# Patient Record
Sex: Male | Born: 1965 | Race: White | Hispanic: No | Marital: Single | Smoking: Current every day smoker
Health system: Southern US, Community
[De-identification: ages and names within clinical notes are randomized; demographics above are authoritative.]

## PROBLEM LIST (undated history)

## (undated) DIAGNOSIS — F419 Anxiety disorder, unspecified: Secondary | ICD-10-CM

---

## 2014-07-01 ENCOUNTER — Emergency Department (HOSPITAL_COMMUNITY)
Admission: EM | Admit: 2014-07-01 | Discharge: 2014-07-02 | Disposition: A | Discharge status: Home / Self Care | Payer: Medicare HMO | Attending: Emergency Medicine | Admitting: Emergency Medicine

## 2014-07-01 ENCOUNTER — Encounter (HOSPITAL_COMMUNITY): Payer: Self-pay | Admitting: Emergency Medicine

## 2014-07-01 ENCOUNTER — Emergency Department (HOSPITAL_COMMUNITY): Payer: Medicare HMO

## 2014-07-01 DIAGNOSIS — M79604 Pain in right leg: Secondary | ICD-10-CM | POA: Diagnosis not present

## 2014-07-01 DIAGNOSIS — M79605 Pain in left leg: Secondary | ICD-10-CM | POA: Diagnosis not present

## 2014-07-01 DIAGNOSIS — Z791 Long term (current) use of non-steroidal anti-inflammatories (NSAID): Secondary | ICD-10-CM | POA: Insufficient documentation

## 2014-07-01 DIAGNOSIS — F419 Anxiety disorder, unspecified: Secondary | ICD-10-CM | POA: Insufficient documentation

## 2014-07-01 DIAGNOSIS — F200 Paranoid schizophrenia: Secondary | ICD-10-CM | POA: Insufficient documentation

## 2014-07-01 DIAGNOSIS — I10 Essential (primary) hypertension: Secondary | ICD-10-CM | POA: Insufficient documentation

## 2014-07-01 DIAGNOSIS — Z5900 Homelessness unspecified: Secondary | ICD-10-CM

## 2014-07-01 DIAGNOSIS — F29 Unspecified psychosis not due to a substance or known physiological condition: Secondary | ICD-10-CM | POA: Insufficient documentation

## 2014-07-01 DIAGNOSIS — Z720 Tobacco use: Secondary | ICD-10-CM | POA: Diagnosis not present

## 2014-07-01 DIAGNOSIS — R609 Edema, unspecified: Secondary | ICD-10-CM | POA: Insufficient documentation

## 2014-07-01 DIAGNOSIS — R000 Tachycardia, unspecified: Secondary | ICD-10-CM | POA: Diagnosis not present

## 2014-07-01 DIAGNOSIS — Z79899 Other long term (current) drug therapy: Secondary | ICD-10-CM | POA: Diagnosis not present

## 2014-07-01 DIAGNOSIS — R440 Auditory hallucinations: Secondary | ICD-10-CM | POA: Diagnosis present

## 2014-07-01 DIAGNOSIS — R443 Hallucinations, unspecified: Secondary | ICD-10-CM

## 2014-07-01 HISTORY — DX: Anxiety disorder, unspecified: F41.9

## 2014-07-01 LAB — ETHANOL: Alcohol, Ethyl (B): <11 mg/dL (ref 0–11)

## 2014-07-01 LAB — CBC
HCT: 42 % (ref 39.0–52.0)
Hemoglobin: 13.9 g/dL (ref 13.0–17.0)
MCH: 30.7 pg (ref 26.0–34.0)
MCHC: 33.1 g/dL (ref 30.0–36.0)
MCV: 92.7 fL (ref 78.0–100.0)
Platelets: 191 10*3/uL (ref 150–400)
RBC: 4.53 MIL/uL (ref 4.22–5.81)
RDW: 13 % (ref 11.5–15.5)
WBC: 8.1 10*3/uL (ref 4.0–10.5)

## 2014-07-01 LAB — RAPID URINE DRUG SCREEN, HOSP PERFORMED
AMPHETAMINES: NOT DETECTED
BARBITURATES: NOT DETECTED
Benzodiazepines: NOT DETECTED
COCAINE: NOT DETECTED
OPIATES: NOT DETECTED
TETRAHYDROCANNABINOL: NOT DETECTED

## 2014-07-01 LAB — COMPREHENSIVE METABOLIC PANEL
ALBUMIN: 3.9 g/dL (ref 3.5–5.2)
ALT: 36 U/L (ref 0–53)
ANION GAP: 12 (ref 5–15)
AST: 22 U/L (ref 0–37)
Alkaline Phosphatase: 66 U/L (ref 39–117)
BILIRUBIN TOTAL: 0.5 mg/dL (ref 0.3–1.2)
BUN: 15 mg/dL (ref 6–23)
CO2: 26 mEq/L (ref 19–32)
Calcium: 9.5 mg/dL (ref 8.4–10.5)
Chloride: 100 mEq/L (ref 96–112)
Creatinine, Ser: 0.94 mg/dL (ref 0.50–1.35)
GFR calc Af Amer: >90 mL/min (ref >90)
GFR calc non Af Amer: >90 mL/min (ref >90)
GLUCOSE: 89 mg/dL (ref 70–99)
Potassium: 4.5 mEq/L (ref 3.7–5.3)
SODIUM: 138 meq/L (ref 137–147)
TOTAL PROTEIN: 7.5 g/dL (ref 6.0–8.3)

## 2014-07-01 LAB — URINALYSIS, ROUTINE W REFLEX MICROSCOPIC
Glucose, UA: NEGATIVE mg/dL
HGB URINE DIPSTICK: NEGATIVE
Ketones, ur: NEGATIVE mg/dL
Leukocytes, UA: NEGATIVE
NITRITE: NEGATIVE
Protein, ur: NEGATIVE mg/dL
Specific Gravity, Urine: 1.027 (ref 1.005–1.030)
UROBILINOGEN UA: 4 mg/dL — AB (ref 0.0–1.0)
pH: 6.5 (ref 5.0–8.0)

## 2014-07-01 LAB — ACETAMINOPHEN LEVEL

## 2014-07-01 LAB — SALICYLATE LEVEL: Salicylate Lvl: <2 mg/dL — ABNORMAL LOW (ref 2.8–20.0)

## 2014-07-01 MED ORDER — SODIUM CHLORIDE 0.9 % IV BOLUS (SEPSIS)
1000.0000 mL | Freq: Once | INTRAVENOUS | Status: AC
  Administered 2014-07-01: 1000 mL via INTRAVENOUS

## 2014-07-01 NOTE — B[facility] Counselor (Signed)
TTS Counselor spoke with pt's PA [name]([name], PA-C) and informed him that she would be initiating pt's assessment shortly. TTS counselor inquired about any additional patient information that would be helpful before beginning the assessment, and PA-C briefly discussed pt's hx and presenting complaints.       Cyndie MullAnna Augusten Lipkin, [facility] T[facility] Specialist

## 2014-07-01 NOTE — ED Notes (Signed)
Pt homeless c/o right leg pain in which he reports he lost his prescription for pain. Pt also reports that he has auditory and visual hallucinations of angels and "in the bible" and "Loistine Chancehilip tells me not to ride the bus". He denies SI or HI. He reports that used crack 3 months ago. Pt very talkative and rambling about many things upon assessment.

## 2014-07-01 NOTE — [facility] Counselor (Signed)
Assessment completed. Pt is endorsing auditory and visual hallucinations and delusional thought content but denying any SI/HI.  Nanine MeansJamison Lord, NP recommends pt be kept overnight and re-evaluated by psychiatry in the morning.       Cyndie MullAnna Sham Alviar, [facility]

## 2014-07-01 NOTE — ED Provider Notes (Signed)
CSN: [id number]     Arrival date & time 07/01/14  1826 History   First MD Initiated Contact with Patient 07/01/14 1941     Chief Complaint  Patient presents with  . Hallucinations  . Leg Pain   Manuel Nichols is a 48 y.o. male with a history of paranoid schizophrenia, anxiety, hypertension and possibly diabetes who presents to the emergency department complaining of bilateral leg pain for the past 5 days as well as auditory and visual hallucinations for the past 3 days. The patient reports that he is coming from [facility] where he was admitted from 04/27/2014 until 06/26/14 on inpatient psychiatry for schizophrenia. The patient reports he [place] pass [place] [facility] [place]. The patient reports he got off in [place] because he heard voices telling him he would be stabbed in [place] has been in [place] for the past week. Patient ports in and off his psychiatric medications for the past 3 days because they were stolen. The patient endorses auditory and visual hallucinations. The patient endorses visual hallucinations of spiritual people. The patient endorses paranoia, thought insertion, thought withdrawal and thought broadcasting. He reports being afraid of a girl who is after him and he is unsure if this is a hallucination. The patient reports he has used crack before but last used 3 months ago. He denies other illicit drug use.  Patient's only physical complaint currently is bilateral leg pain which he states he takes gabapentin. The patient denies suicidal or homicidal ideations. The patient denies fevers, chills, chest pain, shortness of breath, cough, abdominal pain, nausea, vomiting, or diarrhea.   (Consider location/radiation/quality/duration/timing/severity/associated sxs/prior Treatment) HPI  Past Medical History  Diagnosis Date  . Anxiety    History reviewed. No pertinent past surgical history. No family history on file. History  Substance Use  Topics  . Smoking status: Current Every Day Smoker -- 0.50 packs/day    Types: Cigarettes  . Smokeless tobacco: Not on file  . Alcohol Use: Yes     Comment: rarely    Review of Systems  Constitutional: Negative for fever, chills and appetite change.  HENT: Negative for congestion and sore throat.   Eyes: Negative for visual disturbance.  Respiratory: Negative for cough, shortness of breath and wheezing.   Cardiovascular: Negative for chest pain, palpitations and leg swelling.  Gastrointestinal: Negative for nausea, vomiting, abdominal pain and diarrhea.  Genitourinary: Negative for dysuria.  Musculoskeletal: Negative for back pain and neck pain.       Bilateral leg pain  Skin: Negative for rash.  Neurological: Negative for headaches.  Psychiatric/Behavioral: Positive for hallucinations. Negative for suicidal ideas. The patient is nervous/anxious.   All other systems reviewed and are negative.     Allergies  Review of patient's allergies indicates no known allergies.  Home Medications   Prior to Admission medications   Medication Sig Start Date End Date Taking? Authorizing Provider  ARIPiprazole 400 MG SUSR Inject 400 mg into the muscle every 30 (thirty) days.   Yes Historical Provider, MD  gabapentin (NEURONTIN) 600 MG tablet Take 1,200 mg by mouth 3 (three) times daily. 04/17/14  Yes Historical Provider, MD  METFORMIN HCL PO Take by mouth. Unknown dosage was a new prescription and patient lost--pharmacy is unable to verify.   Yes Historical Provider, MD  OLANZapine (ZYPREXA PO) Take by mouth. Unknown dosage was a new prescription and patient lost--pharmacy is unable to verify.   Yes Historical Provider, MD  divalproex (DEPAKOTE ER) 500  MG 24 hr tablet Take 1,000 mg by mouth 3 (three) times daily.  03/31/14   Historical Provider, MD  lisinopril (PRINIVIL,ZESTRIL) 10 MG tablet Take 10 mg by mouth daily. 04/17/14   Historical Provider, MD  naproxen (NAPROSYN) 500 MG tablet Take 500  mg by mouth 2 (two) times daily. 04/17/14   Historical Provider, MD  traZODone (DESYREL) 100 MG tablet Take 100 mg by mouth at bedtime. 03/31/14   Historical Provider, MD   BP 142/82 mmHg  Pulse 110  Temp(Src) 98.8 F (37.1 C) (Oral)  Resp 22  SpO2 93% Physical Exam  Constitutional: He is oriented to person, place, and time. He appears well-developed and well-nourished. No distress.  HENT:  Head: Normocephalic and atraumatic.  Mouth/Throat: Oropharynx is clear and moist.  Eyes: Conjunctivae are normal. Pupils are equal, round, and reactive to light. Right eye exhibits no discharge. Left eye exhibits no discharge.  Neck: Neck supple.  Cardiovascular: Regular rhythm, normal heart sounds and intact distal pulses.  Exam reveals no gallop and no friction rub.   No murmur heard. Tachycardic at 112. Bilateral radial pulses are intact. Bilateral posterior tibialis pulses are intact.  Pulmonary/Chest: Effort normal and breath sounds normal. No respiratory distress. He has no wheezes. He has no rales.  Abdominal: Soft. There is no tenderness.  Musculoskeletal: Normal range of motion. He exhibits edema.  Patient's strength is 5 out of 5 in his bilateral upper and lower extremities. Patient stable hemodynamically without difficulty or assistance. The patient has a mild amount of lower extremity edema. Bilateral posterior tibialis pulses are intact. Sensation is intact in his bilateral lower extremities.  Lymphadenopathy:    He has no cervical adenopathy.  Neurological: He is alert and oriented to person, place, and time. No cranial nerve deficit. Coordination normal.  Patient is alert and oriented 3. Cranial nerves II through XII are intact bilaterally. Patient has good sensation in his face as well as bilateral lower extremities.  Skin: Skin is warm and dry. No rash noted. He is not diaphoretic. No erythema. No pallor.  Psychiatric: His mood appears anxious. Thought content is paranoid. He expresses  no homicidal and no suicidal ideation. He expresses no suicidal plans and no homicidal plans.  Patient endorses visual and auditory hallucinations. Patient endorses thought insertion, thought withdrawal and thought broadcasting. Patient endorses paranoia. Patient's thought processes tangential.  Nursing note and vitals reviewed.   ED Course  Procedures (including critical care time) Labs Review Labs Reviewed  SALICYLATE LEVEL - Abnormal; Notable for the following:    Salicylate Lvl <2.0 (*)    All other components within normal limits  URINALYSIS, ROUTINE W REFLEX MICROSCOPIC - Abnormal; Notable for the following:    Color, Urine AMBER (*)    Bilirubin Urine SMALL (*)    Urobilinogen, UA 4.0 (*)    All other components within normal limits  ACETAMINOPHEN LEVEL  CBC  COMPREHENSIVE METABOLIC PANEL  ETHANOL  URINE RAPID DRUG SCREEN (HOSP PERFORMED)    Imaging Review Dg Chest 2 View  07/01/2014   CLINICAL DATA:  Visual hallucinations.  Right leg pain.  EXAM: CHEST  2 VIEW  COMPARISON:  None.  FINDINGS: Lungs are adequately inflated without focal consolidation or effusion. There is mild cardiomegaly. Mild elevation of the right hemidiaphragm. Mild degenerative change of the spine is present.  IMPRESSION: No acute cardiopulmonary disease.  Mild cardiomegaly.   Electronically Signed   By: Elberta Fortisaniel  Boyle M.D.   On: 07/01/2014 21:48  EKG Interpretation None      Filed Vitals:   07/01/14 1848 07/01/14 2231 07/01/14 2314  BP: 145/71 132/64 142/82  Pulse: 124 105 110  Temp: 99.9 F (37.7 C) 98.5 F (36.9 C) 98.8 F (37.1 C)  TempSrc: Oral Oral Oral  Resp: 18 18 22   SpO2: 96% 96% 93%     MDM   Meds given in ED:  Medications  alum & mag hydroxide-simeth (MAALOX/MYLANTA) 200-200-20 MG/5ML suspension 30 mL (not administered)  ondansetron (ZOFRAN) tablet 4 mg (not administered)  nicotine (NICODERM CQ - dosed in mg/24 hours) patch 21 mg (not administered)  ibuprofen  (ADVIL,MOTRIN) tablet 600 mg (not administered)  acetaminophen (TYLENOL) tablet 650 mg (not administered)  divalproex (DEPAKOTE ER) 24 hr tablet 1,000 mg (not administered)  gabapentin (NEURONTIN) capsule 1,200 mg (not administered)  lisinopril (PRINIVIL,ZESTRIL) tablet 10 mg (not administered)  traZODone (DESYREL) tablet 100 mg (not administered)  sodium chloride 0.9 % bolus 1,000 mL (0 mLs Intravenous Stopped 07/02/14 0014)    New Prescriptions   No medications on file    Final diagnoses:  Hallucinations  Homeless   Manuel Nichols is a 48 y.o. male with a history of paranoid schizophrenia, anxiety, hypertension and possibly diabetes who presents to the emergency department complaining of bilateral leg pain for the past 5 days as well as auditory and visual hallucinations for the past 3 days.The patient reports that he is coming from [facility] where he was admitted from 04/27/2014 until 06/26/14 on inpatient psychiatry for schizophrenia. The patient reports he was given a bus pass to [facility] where his brother lives. The patient reports he got off in [place].   Patient's urine drug screen was negative. Patient's urinalysis was unremarkable. Patient had a negative acetaminophen, salicylate and alcohol level. Patient's CBC and CMP are unremarkable. Patient's chest x-ray and indicated mild cardiomegaly but no evidence of acute cardiopulmonary process. The patient is afebrile and non-toxic appearing. After discussing with Dr. Freida Busman, will medically clear this patient for evaluation by TTS.  TTS would like the patient to stay overnight so that he can be evaluated by psychiatry. Will transfer to the psych ED. Patient is in agreement with staying.   This patient was discussed with Dr. Freida Busman who agrees with assessment and plan.    Lawana Chambers, PA-C 07/02/14 0129  Toy Baker, MD 07/02/14 [phone]

## 2014-07-01 NOTE — [facility] Counselor (Addendum)
TTS Counselor spoke with pt's PA [name](Manuel Dansie, Manuel Nichols) and attending physician (Dr. Lorre NickAnthony Seab) and informed them of disposition. Pt is recommended to stay overnight and be re-evaluated by psychiatry in the morning.   Everlene FarrierWilliam Dansie, Manuel Nichols stated that pt would be moved to [facility] so that he can be seen by psychiatry in the AM.     Manuel MullAnna Sakara Nichols, [facility] T[facility]ist

## 2014-07-02 ENCOUNTER — Encounter (HOSPITAL_COMMUNITY): Payer: Self-pay | Admitting: Registered Nurse

## 2014-07-02 DIAGNOSIS — F29 Unspecified psychosis not due to a substance or known physiological condition: Secondary | ICD-10-CM | POA: Diagnosis present

## 2014-07-02 MED ORDER — IBUPROFEN 200 MG PO TABS: 600.0000 mg | ORAL_TABLET | Freq: Three times a day (TID) | ORAL | Status: DC | PRN

## 2014-07-02 MED ORDER — DIVALPROEX SODIUM ER 500 MG PO TB24
1000.0000 mg | ORAL_TABLET | Freq: Three times a day (TID) | ORAL | Status: DC
  Administered 2014-07-02 (×2): 1000 mg via ORAL
  Filled 2014-07-02 (×4): qty 2

## 2014-07-02 MED ORDER — DIVALPROEX SODIUM ER 500 MG PO TB24: 1000.0000 mg | ORAL_TABLET | Freq: Three times a day (TID) | ORAL | Status: AC

## 2014-07-02 MED ORDER — ACETAMINOPHEN 325 MG PO TABS: 650.0000 mg | ORAL_TABLET | ORAL | Status: DC | PRN

## 2014-07-02 MED ORDER — TRAZODONE HCL 100 MG PO TABS: 100.0000 mg | ORAL_TABLET | Freq: Every day | ORAL | Status: AC

## 2014-07-02 MED ORDER — ALUM & MAG HYDROXIDE-SIMETH 200-200-20 MG/5ML PO SUSP: 30.0000 mL | ORAL | Status: DC | PRN

## 2014-07-02 MED ORDER — GABAPENTIN 300 MG PO CAPS
1200.0000 mg | ORAL_CAPSULE | Freq: Three times a day (TID) | ORAL | Status: DC
  Administered 2014-07-02 (×2): 1200 mg via ORAL
  Filled 2014-07-02 (×2): qty 4

## 2014-07-02 MED ORDER — LISINOPRIL 10 MG PO TABS
10.0000 mg | ORAL_TABLET | Freq: Every day | ORAL | Status: DC
  Filled 2014-07-02 (×2): qty 1

## 2014-07-02 MED ORDER — NICOTINE 21 MG/24HR TD PT24: 21.0000 mg | MEDICATED_PATCH | Freq: Every day | TRANSDERMAL | Status: DC

## 2014-07-02 MED ORDER — ONDANSETRON HCL 4 MG PO TABS: 4.0000 mg | ORAL_TABLET | Freq: Three times a day (TID) | ORAL | Status: DC | PRN

## 2014-07-02 MED ORDER — OLANZAPINE 5 MG PO TABS: 5.0000 mg | ORAL_TABLET | Freq: Every day | ORAL | Status: AC

## 2014-07-02 MED ORDER — TRAZODONE HCL 100 MG PO TABS
100.0000 mg | ORAL_TABLET | Freq: Every day | ORAL | Status: DC
  Administered 2014-07-02: 100 mg via ORAL
  Filled 2014-07-02: qty 1

## 2014-07-02 MED ORDER — ARIPIPRAZOLE ER 400 MG IM SUSR: 400.0000 mg | INTRAMUSCULAR | Status: DC

## 2014-07-02 NOTE — Discharge Instructions (Signed)
Stress Stress-related medical problems are becoming increasingly common. The body has a built-in physical response to stressful situations. Faced with pressure, challenge or danger, we need to react quickly. Our bodies release hormones such as cortisol and adrenaline to help do this. These hormones are part of the "fight or flight" response and affect the metabolic rate, heart rate and blood pressure, resulting in a heightened, stressed state that prepares the body for optimum performance in dealing with a stressful situation. It is likely that early man required these mechanisms to stay alive, but usually modern stresses do not call for this, and the same hormones released in today's world can damage health and reduce coping ability. CAUSES  Pressure to perform at work, at school or in sports.  Threats of physical violence.  Money worries.  Arguments.  Family conflicts.  Divorce or separation from significant other.  Bereavement.  New job or unemployment.  Changes in location.  Alcohol or drug abuse. SOMETIMES, THERE IS NO PARTICULAR REASON FOR DEVELOPING STRESS. Almost all people are at risk of being stressed at some time in their lives. It is important to know that some stress is temporary and some is long term.  Temporary stress will go away when a situation is resolved. Most people can cope with short periods of stress, and it can often be relieved by relaxing, taking a walk or getting any type of exercise, chatting through issues with friends, or having a good night's sleep.  Chronic (long-term, continuous) stress is much harder to deal with. It can be psychologically and emotionally damaging. It can be harmful both for an individual and for friends and family. SYMPTOMS Everyone reacts to stress differently. There are some common effects that help [place] recognize it. In times of extreme stress, people may:  Shake uncontrollably.  Breathe faster and deeper than normal  (hyperventilate).  Vomit.  For people with asthma, stress can trigger an attack.  For some people, stress may trigger migraine headaches, ulcers, and body pain. PHYSICAL EFFECTS OF STRESS MAY INCLUDE:  Loss of energy.  Skin problems.  Aches and pains resulting from tense muscles, including neck ache, backache and tension headaches.  Increased pain from arthritis and other conditions.  Irregular heart beat (palpitations).  Periods of irritability or anger.  Apathy or depression.  Anxiety (feeling uptight or worrying).  Unusual behavior.  Loss of appetite.  Comfort eating.  Lack of concentration.  Loss of, or decreased, sex-drive.  Increased smoking, drinking, or recreational drug use.  For women, missed periods.  Ulcers, joint pain, and muscle pain. Post-traumatic stress is the stress caused by any serious accident, strong emotional damage, or extremely difficult or violent experience such as rape or war. Post-traumatic stress victims can experience mixtures of emotions such as fear, shame, depression, guilt or anger. It may include recurrent memories or images that may be haunting. These feelings can last for weeks, months or even years after the traumatic event that triggered them. Specialized treatment, possibly with medicines and psychological therapies, is available. If stress is causing physical symptoms, severe distress or making it difficult for you to function as normal, it is worth seeing your caregiver. It is important to remember that although stress is a usual part of life, extreme or prolonged stress can lead to other illnesses that will need treatment. It is better to visit a doctor sooner rather than later. Stress has been linked to the development of high blood pressure and heart disease, as well as insomnia and depression.  There is no diagnostic test for stress since everyone reacts to it differently. But a caregiver will be able to spot the physical  symptoms, such as:  Headaches.  Shingles.  Ulcers. Emotional distress such as intense worry, low mood or irritability should be detected when the doctor asks pertinent questions to identify any underlying problems that might be the cause. In case there are physical reasons for the symptoms, the doctor may also want to do some tests to exclude certain conditions. If you feel that you are suffering from stress, try to identify the aspects of your life that are causing it. Sometimes you may not be able to change or avoid them, but even a small change can have a positive ripple effect. A simple lifestyle change can make all the difference. STRATEGIES THAT CAN HELP DEAL WITH STRESS:  Delegating or sharing responsibilities.  Avoiding confrontations.  Learning to be more assertive.  Regular exercise.  Avoid using alcohol or street drugs to cope.  Eating a healthy, balanced diet, rich in fruit and vegetables and proteins.  Finding humor or absurdity in stressful situations.  Never taking on more than you know you can handle comfortably.  Organizing your time better to get as much done as possible.  Talking to friends or family and sharing your thoughts and fears.  Listening to music or relaxation tapes.  Relaxation techniques like deep breathing, meditation, and yoga.  Tensing and then relaxing your muscles, starting at the toes and working up to the head and neck. If you think that you would benefit from help, either in identifying the things that are causing your stress or in learning techniques to help you relax, see a caregiver who is capable of helping you with this. Rather than relying on medications, it is usually better to try and identify the things in your life that are causing stress and try to deal with them. There are many techniques of managing stress including counseling, psychotherapy, aromatherapy, yoga, and exercise. Your caregiver can help you determine what is best  for you. Document Released: 09/27/2002 Document Revised: 07/12/2013 Document Reviewed: 08/24/2007 [facility] Patient Information 2015 [place], [place]. This information is not intended to replace advice given to you by your health care provider. Make sure you discuss any questions you have with your health care provider.  Stress and Stress Management Stress is a normal reaction to life events. It is what you feel when life demands more than you are used to or more than you can handle. Some stress can be useful. For example, the stress reaction can help you catch the last bus of the day, study for a test, or meet a deadline at work. But stress that occurs too often or for too long can cause problems. It can affect your emotional health and interfere with relationships and normal daily activities. Too much stress can weaken your immune system and increase your risk for physical illness. If you already have a medical problem, stress can make it worse. CAUSES  All sorts of life events may cause stress. An event that causes stress for one person may not be stressful for another person. Major life events commonly cause stress. These may be positive or negative. Examples include losing your job, moving into a new home, getting married, having a baby, or losing a loved one. Less obvious life events may also cause stress, especially if they occur day after day or in combination. Examples include working long hours, driving in traffic, caring for children,  being in debt, or being in a difficult relationship. SIGNS AND SYMPTOMS Stress may cause emotional symptoms including, the following:  Anxiety. This is feeling worried, afraid, on edge, overwhelmed, or out of control.  Anger. This is feeling irritated or impatient.  Depression. This is feeling sad, down, helpless, or guilty.  Difficulty focusing, remembering, or making decisions. Stress may cause physical symptoms, including the following:   Aches and  pains. These may affect your head, neck, back, stomach, or other areas of your body.  Tight muscles or clenched jaw.  Low energy or trouble sleeping. Stress may cause unhealthy behaviors, including the following:   Eating to feel better (overeating) or skipping meals.  Sleeping too little, too much, or both.  Working too much or putting off tasks (procrastination).  Smoking, drinking alcohol, or using drugs to feel better. DIAGNOSIS  Stress is diagnosed through an assessment by your health care provider. Your health care provider will ask questions about your symptoms and any stressful life events.Your health care provider will also ask about your medical history and may order blood tests or other tests. Certain medical conditions and medicine can cause physical symptoms similar to stress. Mental illness can cause emotional symptoms and unhealthy behaviors similar to stress. Your health care provider may refer you to a mental health professional for further evaluation.  TREATMENT  Stress management is the recommended treatment for stress.The goals of stress management are reducing stressful life events and coping with stress in healthy ways.  Techniques for reducing stressful life events include the following:  Stress identification. Self-monitor for stress and identify what causes stress for you. These skills may help you to avoid some stressful events.  Time management. Set your priorities, keep a calendar of events, and learn to say "no." These tools can help you avoid making too many commitments. Techniques for coping with stress include the following:  Rethinking the problem. Try to think realistically about stressful events rather than ignoring them or overreacting. Try to find the positives in a stressful situation rather than focusing on the negatives.  Exercise. Physical exercise can release both physical and emotional tension. The key is to find a form of exercise you enjoy  and do it regularly.  Relaxation techniques. These relax the body and mind. Examples include yoga, meditation, tai chi, biofeedback, deep breathing, progressive muscle relaxation, listening to music, being out in nature, journaling, and other hobbies. Again, the key is to find one or more that you enjoy and can do regularly.  Healthy lifestyle. Eat a balanced diet, get plenty of sleep, and do not smoke. Avoid using alcohol or drugs to relax.  Strong support network. Spend time with family, friends, or other people you enjoy being around.Express your feelings and talk things over with someone you trust. Counseling or talktherapy with a mental health professional may be helpful if you are having difficulty managing stress on your own. Medicine is typically not recommended for the treatment of stress.Talk to your health care provider if you think you need medicine for symptoms of stress. HOME CARE INSTRUCTIONS  Keep all follow-up visits as directed by your health care provider.  Take all medicines as directed by your health care provider. SEEK MEDICAL CARE IF:  Your symptoms get worse or you start having new symptoms.  You feel overwhelmed by your problems and can no longer manage them on your own. SEEK IMMEDIATE MEDICAL CARE IF:  You feel like hurting yourself or someone else. Document Released: 12/31/2000 Document Revised:  11/21/2013 Document Reviewed: 03/01/2013 [employer] Patient Information 2015 [place], [place]. This information is not intended to replace advice given to you by your health care provider. Make sure you discuss any questions you have with your health care provider.  Psychosis Psychosis refers to a severe lack of understanding with reality. During a psychotic episode, you are not able to think clearly. During a psychotic episode, your responses and emotions are inappropriate and do not coincide with what is actually happening. You often have false beliefs about what is  happening or who you are (delusions), and you may see, hear, taste, smell, or feel things that are not present (hallucinations). Psychosis is usually a severe symptom of a very serious mental health (psychiatric) condition, but it can sometimes be the result of a medical condition. CAUSES   Psychiatric conditions, such as:  Schizophrenia.  Bipolar disorder.  Depression.  Personality disorders.  Alcohol or drug abuse.  Medical conditions, such as:  Brain injury.  Brain tumor.  Dementia.  Brain diseases, such as Alzheimer's, Parkinson's, or Huntington's disease.  Neurological diseases, such as epilepsy.  Genetic disorders.  Metabolic disorders.  Infections that affect the brain.  Certain prescription drugs.  Stroke. SYMPTOMS   Unable to think or speak clearly or respond appropriately.  Disorganized thinking (thoughts jump from one thought to another).  Severe inappropriate behavior.  Delusions may include:  A strong belief that is odd, unrealistic, or false.  Feeling extremely fearful or suspicious (paranoid).  Believing you are someone else, have high importance, or have an altered identity.  Hallucinations. DIAGNOSIS   Mental health evaluation.  Physical exam.  Blood tests.  Computerized magnetic scan (MRI) or other brain scans. TREATMENT  Your caregiver will recommend a course of treatment that depends on the cause of the psychosis. Treatment may include:  Monitoring and supportive care in the hospital.  Taking medicines (antipsychotic medicine) to reduce symptoms and balance chemicals in the brain.  Taking medicines to manage underlying mental health conditions.  Therapy and other supportive programs outside of the hospital.  Treating an underlying medical condition. If the cause of the psychosis can be treated or corrected, the outlook is good. Without treatment, psychotic episodes can cause danger to yourself or others. Treatment may be  short-term or lifelong. HOME CARE INSTRUCTIONS   Take all medicines as directed. This is important.  Use a pillbox or write down your medicine schedule to make sure you are taking them.  Check with your caregiver before using over-the-counter medicines, herbs, or supplements.  Seek individual and family support through therapy and mental health education (psychoeducation) programs. These will help you manage symptoms and side effects of medicines, learn life skills, and maintain a healthy routine.  Maintain a healthy lifestyle.  Exercise regularly.  Avoid alcohol and drugs.  Learn ways to reduce stress and cope with stress, such as yoga and meditation.  Talk about your feelings with family members or caregivers.  Make time for yourself to do things you enjoy.  Know the early warning signs of psychosis. Your caregiver will recommend steps to take when you notice symptoms such as:  Feeling anxious or preoccupied.  Having racing thoughts.  Changes in your interest in life and relationships.  Follow up with your caregivers for continued outpatient treatment as directed. SEEK MEDICAL CARE IF:   Medicines do not seem to be helping.  You hear voices telling you to do things.  You see, smell, or feel things that are not there.  You feel  hopeless and overwhelmed.  You feel extremely fearful and suspicious that something will harm you.  You feel like you cannot leave your house.  You have trouble taking care of yourself.  You experience side effects of medicines, such as changes in sleep patterns, dizziness, weight gain, restlessness, movement changes, muscle spasms, or tremors. SEEK IMMEDIATE MEDICAL CARE IF:  Severe psychotic symptoms present a safety issue (such as an urge to hurt yourself or others). MAKE SURE YOU:   Understand these instructions.  Will watch your condition.  Will get help right away if you are not doing well or get worse. [place] of [facility]: [email or web address] Document Released: 12/25/2009 Document Revised: 09/29/2011 Document Reviewed: 12/25/2009 [facility] Patient Information 2015 [place], [place]. This information is not intended to replace advice given to you by your health care provider. Make sure you discuss any questions you have with your health care provider.

## 2014-07-02 NOTE — Progress Notes (Signed)
CSW faxed referral to the following facilities:  [facility]  [facility]  Adelene AmasEdith Cashae Weich, [place] Disposition Social Worker [phone]

## 2014-07-02 NOTE — BH Assessment (Signed)
Tele Assessment Note   Manuel Nichols is a single, Caucasian, 48 y.o. male [facility] bilateral leg pain and auditory and visual hallucinations. Pt presents with flat affect, euthymic mood, poor hygiene, and fair eye-contact. Speech is slow and pt is clearly drowsy, but he is cooperative throughout assessment and answers all questions to the best of his ability. Pt is oriented x4. His speech evidences delusional thought pattern, as pt states that people are out to hurt him, that he can "see words coming out of people's eyes", that spirits speak to him and warn him of things like not to ride the bus, etc. Pt reports that he has been out of his psychiatric medications for about 5 days because they were stolen; this has reportedly led to increased hallucinations "but it could just be the spirits inside of me [rather than hallucinations]". Pt has names for these spirits that he sees and hears, calling one of them "Manuel Nichols, the possessor". Pt is homeless but says that he has lived with his brother in [place], [place] off and on for the past 9 years. Pt is unemployed. [facility] from 04/27/14 - 06/26/14. He was given a bus pass to go back to his [place]'s [place] [facility], but he says that he got off of the bus in [place] because he heard voices telling him that he would be stabbed in [place]. Pt reports depressive symptoms such as feelings of worthlessness, fatigue, irritability, poor sleep, etc. In addition, pt reports persecutory beliefs and paranoid ideations, including thought insertion and thought broadcasting.   Pt has a long hx of psychosis, stating that he has heard voices since 1985 and that he has been "seeing spirits" since he was 48 years old. He has a long hx of psychiatric hospitalizations, including [facility] and various hospitals [facility] [place] [facility]. Pt has hx of cocaine and PCP abuse but denies any recent drug or alcohol use. UDS was negative and  BAL < 11. Pt denies any SI/HI and pt denies any past suicide attempts. Pt says he is experiencing heightened leg pain due to being out of his Neurontin, as it was reportedly stolen as well. Pt denies hx of any type of abuse. He currently sees a Therapist, sportspsychiatrist and counselor at [facility] in [place], [place].  295.9   Schizophrenia Axis II: No diagnosis Axis III: Hypertension, bilateral leg pain Past Medical History  Diagnosis Date  . Anxiety    Axis IV: housing problems, other psychosocial or environmental problems, problems with access to health care services and problems with primary support group Axis V: 31-40 impairment in reality testing  Past Medical History:  Past Medical History  Diagnosis Date  . Anxiety     History reviewed. No pertinent past surgical history.  Family History: No family history on file.  Social History:  reports that he has been smoking Cigarettes.  He has been smoking about 0.50 packs per day. He does not have any smokeless tobacco history on file. He reports that he drinks alcohol. His drug history is not on file.  Additional Social History:  Alcohol / Drug Use Pain Medications: Neurontin Prescriptions: Depakote ER, Neurontin, Trazodone, Lisinopril, Zyprexa, Abilify, Metformin Over the Counter: None History of alcohol / drug use?: Yes Longest period of sobriety (when/how long): 3-6 months sober from Cocaine & PCP Negative Consequences of Use:  (Pt denies) Withdrawal Symptoms:  (Pt denies) Substance #1 Name of Substance  1: Cocaine 1 - Age of First Use: 36 1 - Amount (size/oz): $500/night 1 - Frequency: 3x a year 1 - Duration: 12 years 1 - Last Use / Amount: Sept 2015 Substance #2 Name of Substance 2: PCP 2 - Age of First Use: [age] 2 - Amount (size/oz): Unknown 2 - Frequency: "Occasionally" 2 - Duration: 1 year 2 - Last Use / Amount: August 2015  CIWA: CIWA-Ar BP: 142/82 mmHg Pulse Rate: 110 COWS:    PATIENT  STRENGTHS: (choose at least two) Average or above average intelligence Communication skills Supportive family/friends  Allergies: No Known Allergies  Home Medications:  (Not in a hospital admission)  OB/GYN Status:  No LMP for male patient.  General Assessment Data Admission Status: Voluntary           Risk to self with the past 6 months Is patient at risk for suicide?: No, but patient needs Medical Clearance              ADLScreening [facility] Services) Patient's cognitive ability adequate to safely complete daily activities?: No Patient able to express need for assistance with ADLs?: Yes Independently performs ADLs?: Yes (appropriate for developmental age)        ADL Screening (condition at time of admission) Patient's cognitive ability adequate to safely complete daily activities?: No Is the patient deaf or have difficulty hearing?: No Does the patient have difficulty seeing, even when wearing glasses/contacts?: No Does the patient have difficulty concentrating, remembering, or making decisions?: No Patient able to express need for assistance with ADLs?: Yes Does the patient have difficulty dressing or bathing?: No Independently performs ADLs?: Yes (appropriate for developmental age) Does the patient have difficulty walking or climbing stairs?: No Weakness of Legs: None Weakness of Arms/Hands: None  Home Assistive Devices/Equipment Home Assistive Devices/Equipment: None    Abuse/Neglect Assessment (Assessment to be complete while patient is alone) Physical Abuse: Denies Verbal Abuse: Denies Sexual Abuse: Denies Exploitation of patient/patient's resources: Denies Self-Neglect: Denies Possible abuse reported to::  (n/a) Values / Beliefs Cultural Requests During Hospitalization: None Spiritual Requests During Hospitalization: None   Advance Directives (For Healthcare) Does patient have an advance directive?: No Would patient like information  on creating an advanced directive?: No - patient declined information          Disposition: Per Nanine MeansJamison Lord, NP, pt to stay overnight and be re-evaluated by psychiatry in the AM for possible discharge.    Cyndie MullAnna Chrstopher Malenfant, [facility] Triage Specialist  07/02/2014 12:14 AM
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
## 2015-06-14 IMAGING — CR DG CHEST 2V
2 series · 2 of 2 positions shown · non-contrast
Comparison: None.

CLINICAL DATA: Visual hallucinations.  Right leg pain.

EXAM:
CHEST  2 VIEW

[w chest lat]
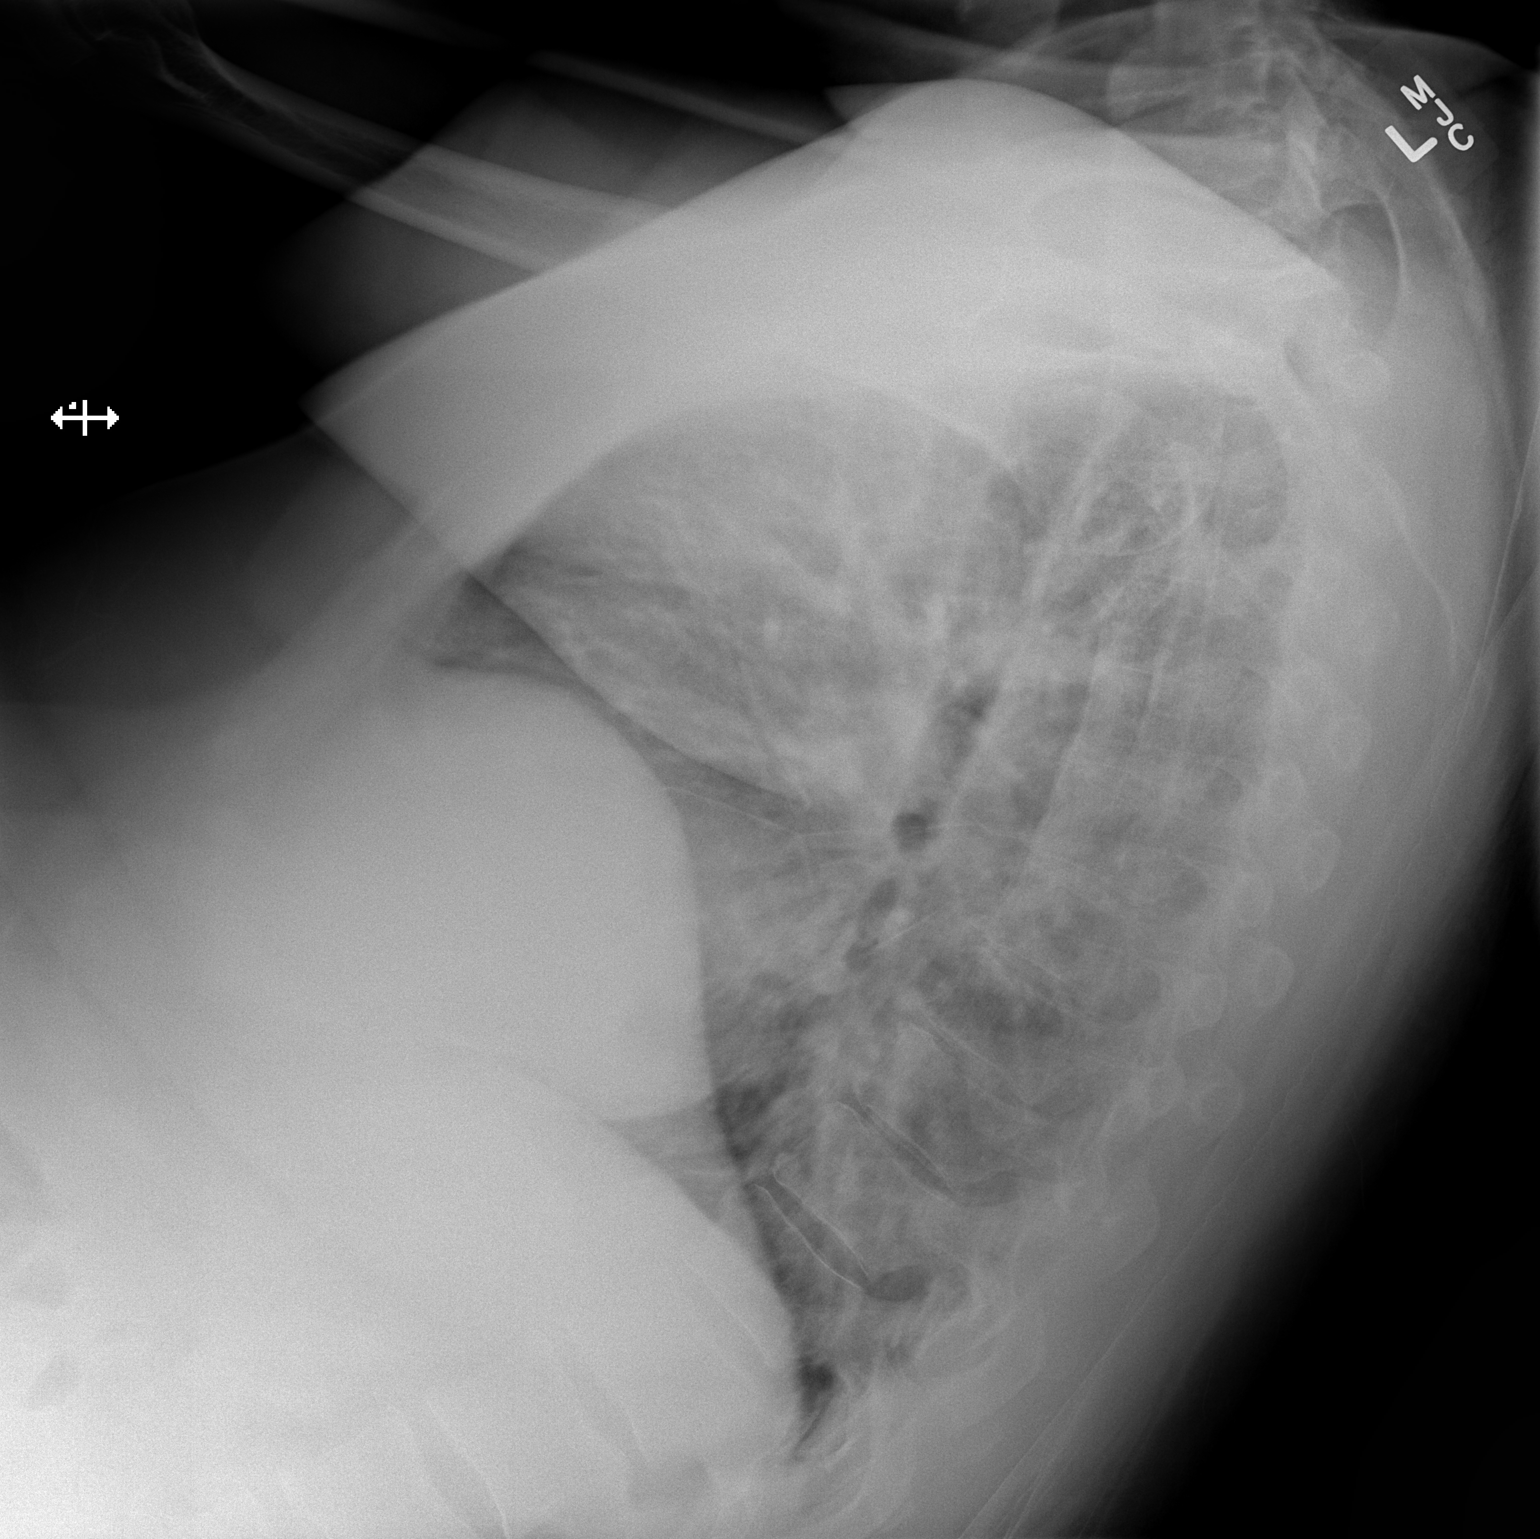

[x chest ap]
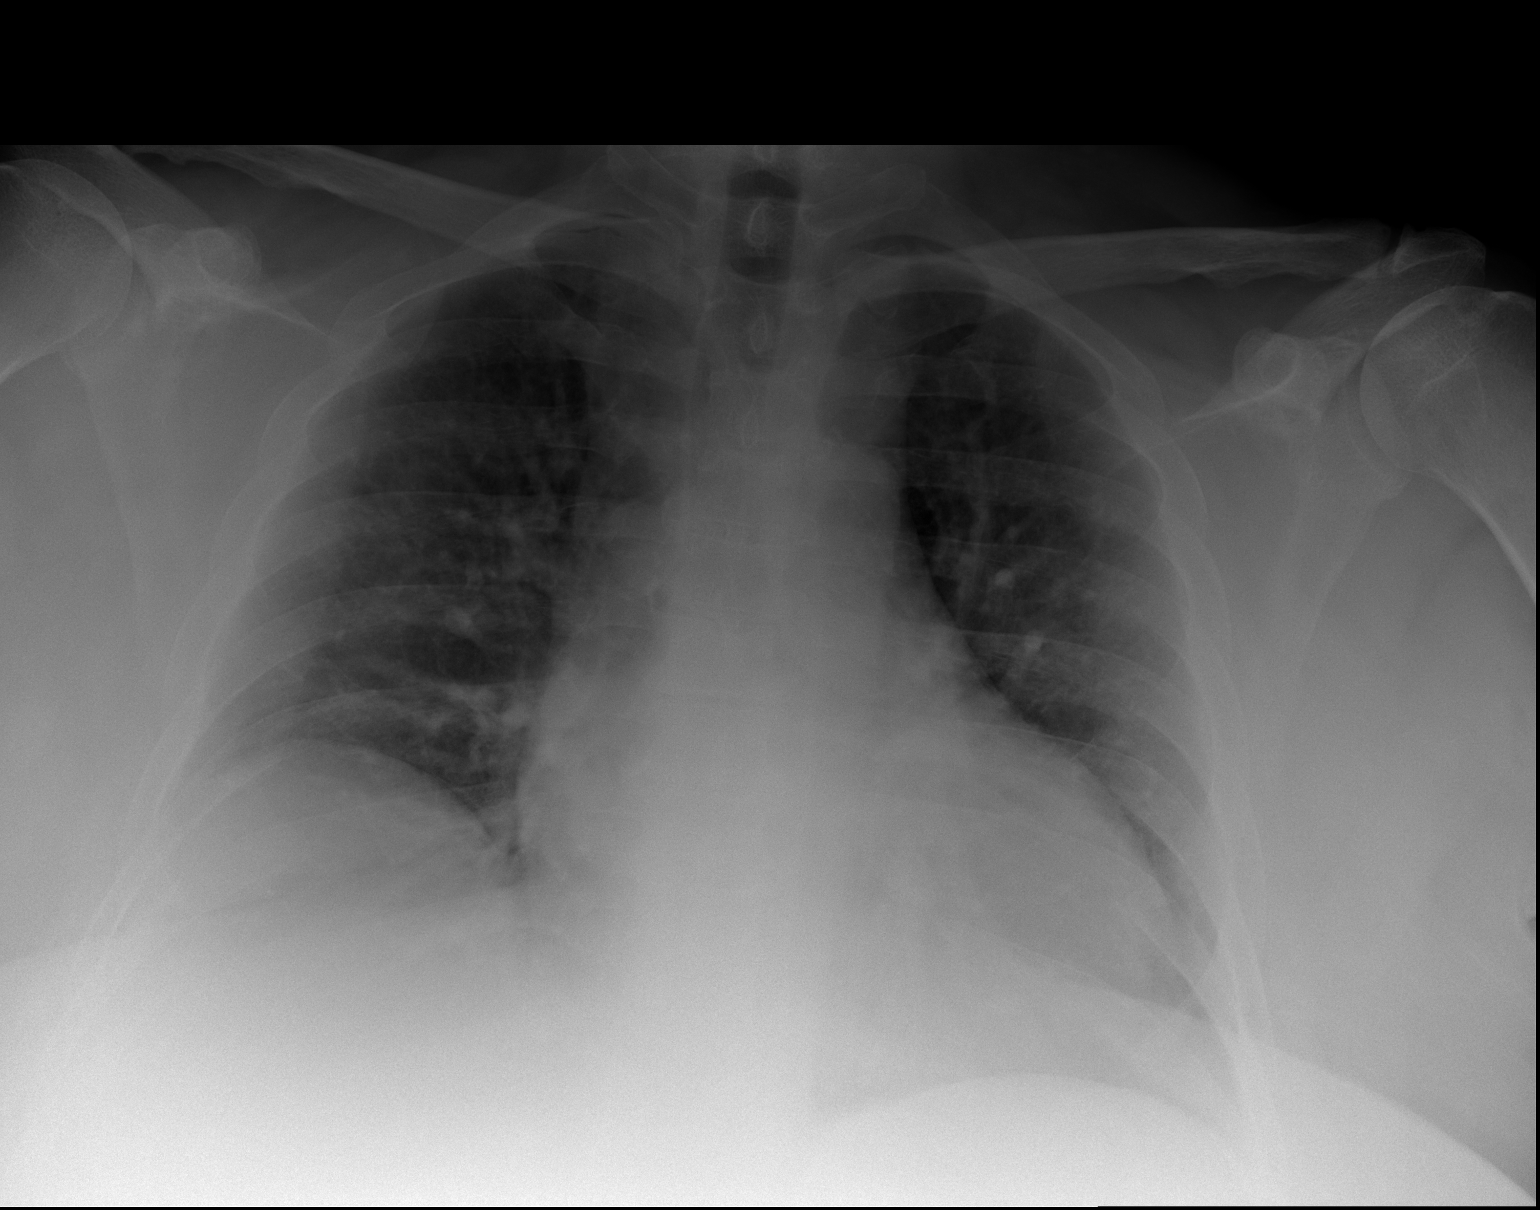

[2 of 2 positions shown; findings below may reference images not displayed]

FINDINGS: Lungs are adequately inflated without focal consolidation or
effusion. There is mild cardiomegaly. Mild elevation of the right
hemidiaphragm. Mild degenerative change of the spine is present.
IMPRESSION: No acute cardiopulmonary disease.

Mild cardiomegaly.
